# Patient Record
Sex: Female | Born: 1935 | Race: White | Hispanic: No | Marital: Married | Smoking: Former smoker
Health system: Southern US, Community
[De-identification: ages and names within clinical notes are randomized; demographics above are authoritative.]

## PROBLEM LIST (undated history)

## (undated) DIAGNOSIS — F0390 Unspecified dementia without behavioral disturbance: Secondary | ICD-10-CM

## (undated) DIAGNOSIS — I10 Essential (primary) hypertension: Secondary | ICD-10-CM

## (undated) DIAGNOSIS — J45909 Unspecified asthma, uncomplicated: Secondary | ICD-10-CM

## (undated) HISTORY — PX: TOTAL HIP ARTHROPLASTY: SHX124

## (undated) HISTORY — PX: ROTATOR CUFF REPAIR: SHX139

## (undated) HISTORY — PX: FEMUR FRACTURE SURGERY: SHX633

## (undated) HISTORY — PX: CHOLECYSTECTOMY: SHX55

## (undated) HISTORY — PX: ABDOMINAL HYSTERECTOMY: SHX81

---

## 1997-10-14 ENCOUNTER — Ambulatory Visit (HOSPITAL_COMMUNITY): Admission: RE | Admit: 1997-10-14 | Discharge: 1997-10-14 | Payer: Self-pay | Admitting: Sports Medicine

## 1997-10-14 ENCOUNTER — Encounter: Admission: RE | Admit: 1997-10-14 | Discharge: 1998-01-12 | Payer: Self-pay | Admitting: Orthopedic Surgery

## 1998-04-26 ENCOUNTER — Encounter: Payer: Self-pay | Admitting: Orthopedic Surgery

## 1998-04-26 ENCOUNTER — Inpatient Hospital Stay (HOSPITAL_COMMUNITY): Admission: RE | Admit: 1998-04-26 | Discharge: 1998-04-30 | Payer: Self-pay | Admitting: Orthopedic Surgery

## 1998-05-03 ENCOUNTER — Encounter (HOSPITAL_COMMUNITY): Admission: RE | Admit: 1998-05-03 | Discharge: 1998-08-01 | Payer: Self-pay | Admitting: Orthopedic Surgery

## 1999-04-28 ENCOUNTER — Encounter: Payer: Self-pay | Admitting: Orthopedic Surgery

## 1999-05-02 ENCOUNTER — Observation Stay (HOSPITAL_COMMUNITY): Admission: RE | Admit: 1999-05-02 | Discharge: 1999-05-03 | Payer: Self-pay | Admitting: Orthopedic Surgery

## 1999-12-06 ENCOUNTER — Encounter: Admission: RE | Admit: 1999-12-06 | Discharge: 1999-12-06 | Payer: Self-pay | Admitting: Geriatric Medicine

## 1999-12-06 ENCOUNTER — Encounter: Payer: Self-pay | Admitting: Geriatric Medicine

## 2000-02-06 ENCOUNTER — Encounter: Admission: RE | Admit: 2000-02-06 | Discharge: 2000-02-06 | Payer: Self-pay | Admitting: Geriatric Medicine

## 2000-02-06 ENCOUNTER — Encounter: Payer: Self-pay | Admitting: Geriatric Medicine

## 2001-02-06 ENCOUNTER — Encounter: Admission: RE | Admit: 2001-02-06 | Discharge: 2001-02-06 | Payer: Self-pay | Admitting: Geriatric Medicine

## 2001-02-06 ENCOUNTER — Encounter: Payer: Self-pay | Admitting: Geriatric Medicine

## 2002-12-08 ENCOUNTER — Encounter: Admission: RE | Admit: 2002-12-08 | Discharge: 2002-12-08 | Payer: Self-pay | Admitting: Geriatric Medicine

## 2002-12-08 ENCOUNTER — Encounter: Payer: Self-pay | Admitting: Geriatric Medicine

## 2005-05-18 ENCOUNTER — Encounter: Admission: RE | Admit: 2005-05-18 | Discharge: 2005-05-18 | Payer: Self-pay | Admitting: Geriatric Medicine

## 2011-02-04 ENCOUNTER — Emergency Department (HOSPITAL_COMMUNITY)
Admission: EM | Admit: 2011-02-04 | Discharge: 2011-02-04 | Disposition: A | Discharge status: Home / Self Care | Payer: Medicare Other | Attending: Emergency Medicine | Admitting: Emergency Medicine

## 2011-02-04 ENCOUNTER — Emergency Department (HOSPITAL_COMMUNITY): Payer: Medicare Other

## 2011-02-04 DIAGNOSIS — R05 Cough: Secondary | ICD-10-CM | POA: Insufficient documentation

## 2011-02-04 DIAGNOSIS — Z79899 Other long term (current) drug therapy: Secondary | ICD-10-CM | POA: Insufficient documentation

## 2011-02-04 DIAGNOSIS — I10 Essential (primary) hypertension: Secondary | ICD-10-CM | POA: Insufficient documentation

## 2011-02-04 DIAGNOSIS — M810 Age-related osteoporosis without current pathological fracture: Secondary | ICD-10-CM | POA: Insufficient documentation

## 2011-02-04 DIAGNOSIS — R0609 Other forms of dyspnea: Secondary | ICD-10-CM | POA: Insufficient documentation

## 2011-02-04 DIAGNOSIS — J189 Pneumonia, unspecified organism: Secondary | ICD-10-CM | POA: Insufficient documentation

## 2011-02-04 DIAGNOSIS — R059 Cough, unspecified: Secondary | ICD-10-CM | POA: Insufficient documentation

## 2011-02-04 DIAGNOSIS — R0602 Shortness of breath: Secondary | ICD-10-CM | POA: Insufficient documentation

## 2011-02-04 DIAGNOSIS — F068 Other specified mental disorders due to known physiological condition: Secondary | ICD-10-CM | POA: Insufficient documentation

## 2011-02-04 DIAGNOSIS — J45909 Unspecified asthma, uncomplicated: Secondary | ICD-10-CM | POA: Insufficient documentation

## 2011-02-04 DIAGNOSIS — R0989 Other specified symptoms and signs involving the circulatory and respiratory systems: Secondary | ICD-10-CM | POA: Insufficient documentation

## 2011-02-04 LAB — CBC
MCH: 28.5 pg (ref 26.0–34.0)
Platelets: 234 10*3/uL (ref 150–400)
RBC: 4.38 MIL/uL (ref 3.87–5.11)
RDW: 13.7 % (ref 11.5–15.5)
WBC: 6.9 10*3/uL (ref 4.0–10.5)

## 2011-02-04 LAB — DIFFERENTIAL
Basophils Relative: 0 % (ref 0–1)
Eosinophils Absolute: 0.1 10*3/uL (ref 0.0–0.7)
Monocytes Relative: 11 % (ref 3–12)
Neutrophils Relative %: 59 % (ref 43–77)

## 2011-02-04 LAB — CK TOTAL AND CKMB (NOT AT ARMC): Total CK: 173 U/L (ref 7–177)

## 2011-02-04 LAB — COMPREHENSIVE METABOLIC PANEL
AST: 22 U/L (ref 0–37)
BUN: 17 mg/dL (ref 6–23)
CO2: 28 mEq/L (ref 19–32)
Calcium: 9.4 mg/dL (ref 8.4–10.5)
Creatinine, Ser: 0.62 mg/dL (ref 0.50–1.10)
GFR calc Af Amer: >60 mL/min (ref >60)
GFR calc non Af Amer: >60 mL/min (ref >60)

## 2011-02-04 LAB — URINE MICROSCOPIC-ADD ON

## 2011-02-04 LAB — URINALYSIS, ROUTINE W REFLEX MICROSCOPIC
Glucose, UA: NEGATIVE mg/dL
pH: 7.5 (ref 5.0–8.0)

## 2011-02-04 LAB — TROPONIN I: Troponin I: <0.3 ng/mL (ref <0.30)

## 2011-04-11 ENCOUNTER — Emergency Department (HOSPITAL_COMMUNITY): Payer: Medicare Other

## 2011-04-11 ENCOUNTER — Emergency Department (HOSPITAL_COMMUNITY)
Admission: EM | Admit: 2011-04-11 | Discharge: 2011-04-11 | Disposition: A | Discharge status: Home / Self Care | Payer: Medicare Other | Attending: Emergency Medicine | Admitting: Emergency Medicine

## 2011-04-11 DIAGNOSIS — S0003XA Contusion of scalp, initial encounter: Secondary | ICD-10-CM | POA: Insufficient documentation

## 2011-04-11 DIAGNOSIS — S0083XA Contusion of other part of head, initial encounter: Secondary | ICD-10-CM | POA: Insufficient documentation

## 2011-04-11 DIAGNOSIS — W1809XA Striking against other object with subsequent fall, initial encounter: Secondary | ICD-10-CM | POA: Insufficient documentation

## 2011-04-11 DIAGNOSIS — I10 Essential (primary) hypertension: Secondary | ICD-10-CM | POA: Insufficient documentation

## 2011-04-11 DIAGNOSIS — R51 Headache: Secondary | ICD-10-CM | POA: Insufficient documentation

## 2011-04-11 DIAGNOSIS — F068 Other specified mental disorders due to known physiological condition: Secondary | ICD-10-CM | POA: Insufficient documentation

## 2011-04-11 DIAGNOSIS — Y92009 Unspecified place in unspecified non-institutional (private) residence as the place of occurrence of the external cause: Secondary | ICD-10-CM | POA: Insufficient documentation

## 2011-04-11 DIAGNOSIS — Z79899 Other long term (current) drug therapy: Secondary | ICD-10-CM | POA: Insufficient documentation

## 2012-01-06 ENCOUNTER — Encounter (HOSPITAL_COMMUNITY): Payer: Self-pay | Admitting: *Deleted

## 2012-01-06 ENCOUNTER — Emergency Department (HOSPITAL_COMMUNITY): Payer: Medicare Other

## 2012-01-06 ENCOUNTER — Emergency Department (HOSPITAL_COMMUNITY)
Admission: EM | Admit: 2012-01-06 | Discharge: 2012-01-06 | Disposition: A | Discharge status: Home / Self Care | Payer: Medicare Other | Attending: Emergency Medicine | Admitting: Emergency Medicine

## 2012-01-06 DIAGNOSIS — R5381 Other malaise: Secondary | ICD-10-CM | POA: Insufficient documentation

## 2012-01-06 DIAGNOSIS — F068 Other specified mental disorders due to known physiological condition: Secondary | ICD-10-CM | POA: Insufficient documentation

## 2012-01-06 DIAGNOSIS — Z8673 Personal history of transient ischemic attack (TIA), and cerebral infarction without residual deficits: Secondary | ICD-10-CM | POA: Insufficient documentation

## 2012-01-06 DIAGNOSIS — Z79899 Other long term (current) drug therapy: Secondary | ICD-10-CM | POA: Insufficient documentation

## 2012-01-06 DIAGNOSIS — N760 Acute vaginitis: Secondary | ICD-10-CM | POA: Insufficient documentation

## 2012-01-06 DIAGNOSIS — Z66 Do not resuscitate: Secondary | ICD-10-CM | POA: Insufficient documentation

## 2012-01-06 DIAGNOSIS — R51 Headache: Secondary | ICD-10-CM | POA: Insufficient documentation

## 2012-01-06 DIAGNOSIS — R4701 Aphasia: Secondary | ICD-10-CM | POA: Insufficient documentation

## 2012-01-06 DIAGNOSIS — I10 Essential (primary) hypertension: Secondary | ICD-10-CM | POA: Insufficient documentation

## 2012-01-06 DIAGNOSIS — J45909 Unspecified asthma, uncomplicated: Secondary | ICD-10-CM | POA: Insufficient documentation

## 2012-01-06 DIAGNOSIS — R4182 Altered mental status, unspecified: Secondary | ICD-10-CM | POA: Insufficient documentation

## 2012-01-06 DIAGNOSIS — L0390 Cellulitis, unspecified: Secondary | ICD-10-CM

## 2012-01-06 DIAGNOSIS — R471 Dysarthria and anarthria: Secondary | ICD-10-CM

## 2012-01-06 HISTORY — DX: Essential (primary) hypertension: I10

## 2012-01-06 HISTORY — DX: Unspecified dementia, unspecified severity, without behavioral disturbance, psychotic disturbance, mood disturbance, and anxiety: F03.90

## 2012-01-06 HISTORY — DX: Unspecified asthma, uncomplicated: J45.909

## 2012-01-06 LAB — DIFFERENTIAL
Basophils Absolute: 0 10*3/uL (ref 0.0–0.1)
Basophils Relative: 0 % (ref 0–1)
Eosinophils Absolute: 0.1 10*3/uL (ref 0.0–0.7)
Neutro Abs: 4.3 10*3/uL (ref 1.7–7.7)
Neutrophils Relative %: 64 % (ref 43–77)

## 2012-01-06 LAB — URINALYSIS, ROUTINE W REFLEX MICROSCOPIC
Bilirubin Urine: NEGATIVE
Ketones, ur: NEGATIVE mg/dL
Leukocytes, UA: NEGATIVE
Nitrite: NEGATIVE
Specific Gravity, Urine: 1.015 (ref 1.005–1.030)
Urobilinogen, UA: 0.2 mg/dL (ref 0.0–1.0)
pH: 7.5 (ref 5.0–8.0)

## 2012-01-06 LAB — BASIC METABOLIC PANEL
Chloride: 97 mEq/L (ref 96–112)
GFR calc Af Amer: 70 mL/min — ABNORMAL LOW (ref >90)
GFR calc non Af Amer: 60 mL/min — ABNORMAL LOW (ref >90)
Potassium: 3.7 mEq/L (ref 3.5–5.1)

## 2012-01-06 LAB — CBC
MCH: 28.5 pg (ref 26.0–34.0)
MCHC: 32.5 g/dL (ref 30.0–36.0)
Platelets: 200 10*3/uL (ref 150–400)
RDW: 13.8 % (ref 11.5–15.5)

## 2012-01-06 LAB — GLUCOSE, CAPILLARY: Glucose-Capillary: 86 mg/dL (ref 70–99)

## 2012-01-06 MED ORDER — LORAZEPAM 1 MG PO TABS
ORAL_TABLET | ORAL | Status: AC
  Filled 2012-01-06: qty 1

## 2012-01-06 MED ORDER — LORAZEPAM 1 MG PO TABS
1.0000 mg | ORAL_TABLET | Freq: Once | ORAL | Status: AC
  Administered 2012-01-06: 1 mg via ORAL

## 2012-01-06 MED ORDER — CEPHALEXIN 500 MG PO CAPS: 500.0000 mg | ORAL_CAPSULE | Freq: Two times a day (BID) | ORAL | Status: AC

## 2012-01-06 NOTE — ED Notes (Signed)
Daughter advised that he mother has had some confused thoughts and her speech has been inappropriate this morning.  Forgot how to eat breakfast and that she is shuffling her gait and that she normally ambulates with a cain.

## 2012-01-06 NOTE — ED Provider Notes (Signed)
History     CSN: [id number]  Arrival date & time 01/06/12  1337   First MD Initiated Contact with Patient 01/06/12 1459      Chief Complaint  Patient presents with  . Aphasia  . Weakness     Patient is a 78 y.o. female presenting with weakness. The history is provided by the patient and a relative. The history is limited by the condition of the patient.  Weakness The primary symptoms include headaches and altered mental status. Primary symptoms do not include syncope, loss of consciousness, fever or vomiting. Episode onset: an unknown time ago. The symptoms are unchanged. The neurological symptoms are diffuse.  The headache is associated with weakness.  Additional symptoms include weakness. Medical issues do not include seizures.  pt was last seen normal last night This morning patient was noted to be confused, had difficulty speaking (nonsensical and also dysarthria) and difficulty with walking No recent falls No fever Pt lives at home but has 24/7 care but can do some ADLs on her own at times but now having difficulty walking Family confirms she is a DNR She has no h/o CVA per family  Past Medical History  Diagnosis Date  . Asthma   . Hypertension   . Dementia     History reviewed. No pertinent past surgical history.  History reviewed. No pertinent family history.  History  Substance Use Topics  . Smoking status: Not on file  . Smokeless tobacco: Not on file  . Alcohol Use: No    OB History    Grav Para Term Preterm Abortions TAB SAB Ect Mult Living                  Review of Systems  Unable to perform ROS: Mental status change  Constitutional: Negative for fever.  Cardiovascular: Negative for syncope.  Gastrointestinal: Negative for vomiting.  Neurological: Positive for weakness and headaches. Negative for loss of consciousness.  Psychiatric/Behavioral: Positive for altered mental status.    Allergies  Review of patient's allergies indicates no known  allergies.  Home Medications   Current Outpatient Rx  Name Route Sig Dispense Refill  . ALBUTEROL SULFATE HFA 108 (90 BASE) MCG/ACT IN AERS Inhalation Inhale 2 puffs into the lungs every 6 (six) hours as needed. For shortness of breath.    Maximino Green[place] 18-103 MCG/ACT IN AERO Inhalation Inhale 2 puffs into the lungs 4 (four) times daily.    Marland Kitchen CALCIUM CARBONATE-VITAMIN D 500-200 MG-UNIT PO TABS Oral Take 1 tablet by mouth daily.    . DONEPEZIL HCL 10 MG PO TABS Oral Take 10 mg by mouth daily.    Marland Kitchen LORAZEPAM 0.5 MG PO TABS Oral Take 0.25 mg by mouth every 6 (six) hours as needed. For anxiety.    Marland Kitchen METOPROLOL SUCCINATE ER 50 MG PO TB24 Oral Take 50 mg by mouth daily. Take with or immediately following a meal.    . ADULT MULTIVITAMIN W/MINERALS CH Oral Take 1 tablet by mouth daily.    Marland Kitchen NISOLDIPINE ER 17 MG PO TB24 Oral Take 17 mg by mouth daily.    Marland Kitchen VITAMIN D (ERGOCALCIFEROL) 50000 UNITS PO CAPS Oral Take 50,000 Units by mouth every 7 (seven) days. Take on Tuesdays.      BP 165/76  Pulse 66  Temp 97.8 F (36.6 C) (Rectal)  Resp 23  SpO2 93%  Physical Exam CONSTITUTIONAL: Well developed/well nourished HEAD AND FACE: Normocephalic/atraumatic EYES: EOMI/PERRL ENMT: Mucous membranes moist NECK: supple no meningeal signs  SPINE:entire spine nontender CV: S1/S2 noted LUNGS: Lungs are clear to auscultation bilaterally, no apparent distress ABDOMEN: soft, nontender, no rebound or guarding GU:no cva tenderness NEURO: Pt is awake/alert, moves all extremitiesx4, no arm/leg drift.  No facial droop.  She has mild dysarthria, she is confused and unable to confirm current date or her birthdate EXTREMITIES: pulses normal, full ROM SKIN: warm, color normal PSYCH: no abnormalities of mood noted  ED Course  Procedures    Labs Reviewed  GLUCOSE, CAPILLARY  CBC  DIFFERENTIAL  BASIC METABOLIC PANEL  URINALYSIS, ROUTINE W REFLEX MICROSCOPIC   tPA in stroke considered but not given  due to:  Onset over 3-4.5hours  6:00 PM Pt with some mild agitation, wanting to go home Long discussion with family, and informed that she may have had stroke.  Would recommend admit, however they elect to take patient home.  She is a DNR, would not want further intervention and she has home care 24/7 She will start ASA and f/u with PCP this week Also has some erythema/tenderness to vulvar region that appears mild cellulitis (noticed by nurse during cath) Will start on keflex Discussed strict return precautions   MDM  Nursing notes including past medical history and social history reviewed and considered in documentation labs/vitals reviewed and considered        Date: 01/06/2012  Rate: 61  Rhythm: normal sinus rhythm  QRS Axis: normal  Intervals: normal  ST/T Wave abnormalities: nonspecific ST changes  Conduction Disutrbances:none  Narrative Interpretation:   Old EKG Reviewed: unchanged    Joya Gaskins, MD 01/06/12 [phone]

## 2012-01-06 NOTE — ED Notes (Signed)
Patient is resting comfortably. Returned from CT

## 2012-01-06 NOTE — ED Notes (Signed)
Family members report that pt was last seen normal last night, home health staff reports pt was not speaking clearly when she got up this am, was unable to feed herself or dress herself, having severe headache and unsteady gait.

## 2012-01-06 NOTE — ED Notes (Signed)
Patient discharged with  Her daughter with instructions and prescription NAD noted at the time of discharge

## 2012-01-06 NOTE — ED Notes (Signed)
Patient very agitated want to get out of the bed and go home

## 2013-03-15 ENCOUNTER — Inpatient Hospital Stay: Payer: Self-pay | Admitting: Internal Medicine

## 2013-03-15 ENCOUNTER — Ambulatory Visit: Payer: Self-pay | Admitting: Orthopaedic Surgery

## 2013-03-15 LAB — URINALYSIS, COMPLETE
Bacteria: NONE SEEN
Glucose,UR: NEGATIVE mg/dL (ref 0–75)
Ph: 7 (ref 4.5–8.0)
Protein: NEGATIVE
RBC,UR: 2 /HPF (ref 0–5)
Specific Gravity: 1.009 (ref 1.003–1.030)
Squamous Epithelial: <1

## 2013-03-15 LAB — COMPREHENSIVE METABOLIC PANEL
Albumin: 2.9 g/dL — ABNORMAL LOW (ref 3.4–5.0)
Alkaline Phosphatase: 91 U/L (ref 50–136)
EGFR (African American): >60
EGFR (Non-African Amer.): >60
Glucose: 150 mg/dL — ABNORMAL HIGH (ref 65–99)
Potassium: 3.4 mmol/L — ABNORMAL LOW (ref 3.5–5.1)
Total Protein: 7.4 g/dL (ref 6.4–8.2)

## 2013-03-15 LAB — CBC
HCT: 36.7 % (ref 35.0–47.0)
MCH: 28.5 pg (ref 26.0–34.0)
MCHC: 33.5 g/dL (ref 32.0–36.0)
Platelet: 319 10*3/uL (ref 150–440)
RBC: 4.3 10*6/uL (ref 3.80–5.20)
RDW: 12.7 % (ref 11.5–14.5)

## 2013-03-15 LAB — CK TOTAL AND CKMB (NOT AT ARMC)
CK, Total: 85 U/L (ref 21–215)
CK, Total: 190 U/L (ref 21–215)
CK-MB: 1.2 ng/mL (ref 0.5–3.6)
CK-MB: 2.3 ng/mL (ref 0.5–3.6)

## 2013-03-15 LAB — TROPONIN I
Troponin-I: <0.02 ng/mL
Troponin-I: <0.02 ng/mL

## 2013-03-16 LAB — CBC WITH DIFFERENTIAL/PLATELET
Basophil #: 0.1 10*3/uL (ref 0.0–0.1)
Basophil %: 0.5 %
Eosinophil #: 0.1 10*3/uL (ref 0.0–0.7)
HGB: 10.2 g/dL — ABNORMAL LOW (ref 12.0–16.0)
Lymphocyte #: 1.2 10*3/uL (ref 1.0–3.6)
Lymphocyte %: 10.6 %
Monocyte #: 1 x10 3/mm — ABNORMAL HIGH (ref 0.2–0.9)
RBC: 3.55 10*6/uL — ABNORMAL LOW (ref 3.80–5.20)
RDW: 12.4 % (ref 11.5–14.5)
WBC: 11 10*3/uL (ref 3.6–11.0)

## 2013-03-16 LAB — BASIC METABOLIC PANEL
BUN: 11 mg/dL (ref 7–18)
Chloride: 98 mmol/L (ref 98–107)
Co2: 28 mmol/L (ref 21–32)
EGFR (African American): >60
Potassium: 4.1 mmol/L (ref 3.5–5.1)
Sodium: 132 mmol/L — ABNORMAL LOW (ref 136–145)

## 2013-03-16 LAB — MAGNESIUM: Magnesium: 1.7 mg/dL — ABNORMAL LOW

## 2013-03-17 LAB — CBC WITH DIFFERENTIAL/PLATELET
HCT: 27.5 % — ABNORMAL LOW (ref 35.0–47.0)
Lymphocyte #: 1.2 10*3/uL (ref 1.0–3.6)
Lymphocyte %: 14.6 %
MCHC: 34.1 g/dL (ref 32.0–36.0)
MCV: 84 fL (ref 80–100)
Monocyte #: 0.7 x10 3/mm (ref 0.2–0.9)
Monocyte %: 8 %
Neutrophil #: 6.3 10*3/uL (ref 1.4–6.5)
Neutrophil %: 75.1 %
RBC: 3.26 10*6/uL — ABNORMAL LOW (ref 3.80–5.20)

## 2013-03-17 LAB — BASIC METABOLIC PANEL
Anion Gap: 5 — ABNORMAL LOW (ref 7–16)
BUN: 15 mg/dL (ref 7–18)
Co2: 28 mmol/L (ref 21–32)
EGFR (Non-African Amer.): >60
Glucose: 94 mg/dL (ref 65–99)
Potassium: 3.9 mmol/L (ref 3.5–5.1)

## 2013-03-18 LAB — MAGNESIUM: Magnesium: 1.9 mg/dL

## 2013-03-18 LAB — BASIC METABOLIC PANEL
BUN: 16 mg/dL (ref 7–18)
Co2: 26 mmol/L (ref 21–32)
Creatinine: 0.64 mg/dL (ref 0.60–1.30)
EGFR (Non-African Amer.): >60
Osmolality: 270 (ref 275–301)
Sodium: 134 mmol/L — ABNORMAL LOW (ref 136–145)

## 2013-03-19 LAB — BASIC METABOLIC PANEL
Anion Gap: 7 (ref 7–16)
BUN: 17 mg/dL (ref 7–18)
Calcium, Total: 8 mg/dL — ABNORMAL LOW (ref 8.5–10.1)
Chloride: 103 mmol/L (ref 98–107)
Creatinine: 0.55 mg/dL — ABNORMAL LOW (ref 0.60–1.30)
Glucose: 101 mg/dL — ABNORMAL HIGH (ref 65–99)
Osmolality: 272 (ref 275–301)

## 2013-04-28 ENCOUNTER — Other Ambulatory Visit (HOSPITAL_COMMUNITY): Payer: Self-pay | Admitting: Internal Medicine

## 2013-04-28 ENCOUNTER — Ambulatory Visit (HOSPITAL_COMMUNITY)
Admission: RE | Admit: 2013-04-28 | Discharge: 2013-04-28 | Disposition: A | Discharge status: Home / Self Care | Payer: Medicare Other | Source: Ambulatory Visit | Attending: Vascular Surgery | Admitting: Vascular Surgery

## 2013-04-28 DIAGNOSIS — R609 Edema, unspecified: Secondary | ICD-10-CM

## 2013-04-29 ENCOUNTER — Other Ambulatory Visit: Payer: Self-pay | Admitting: Internal Medicine

## 2013-04-29 ENCOUNTER — Ambulatory Visit
Admission: RE | Admit: 2013-04-29 | Discharge: 2013-04-29 | Disposition: A | Discharge status: Home / Self Care | Payer: Medicare Other | Source: Ambulatory Visit | Attending: Internal Medicine | Admitting: Internal Medicine

## 2013-04-29 DIAGNOSIS — R000 Tachycardia, unspecified: Secondary | ICD-10-CM

## 2013-04-29 DIAGNOSIS — R0902 Hypoxemia: Secondary | ICD-10-CM

## 2013-04-29 MED ORDER — IOHEXOL 350 MG/ML SOLN
100.0000 mL | Freq: Once | INTRAVENOUS | Status: AC | PRN
  Administered 2013-04-29: 100 mL via INTRAVENOUS

## 2013-05-29 ENCOUNTER — Encounter (HOSPITAL_COMMUNITY): Payer: Self-pay | Admitting: Emergency Medicine

## 2013-05-29 ENCOUNTER — Emergency Department (HOSPITAL_COMMUNITY): Payer: Medicare Other

## 2013-05-29 ENCOUNTER — Emergency Department (HOSPITAL_COMMUNITY)
Admission: EM | Admit: 2013-05-29 | Discharge: 2013-05-29 | Disposition: A | Discharge status: Home / Self Care | Payer: Medicare Other | Attending: Emergency Medicine | Admitting: Emergency Medicine

## 2013-05-29 DIAGNOSIS — L03031 Cellulitis of right toe: Secondary | ICD-10-CM

## 2013-05-29 DIAGNOSIS — W19XXXA Unspecified fall, initial encounter: Secondary | ICD-10-CM

## 2013-05-29 DIAGNOSIS — L02619 Cutaneous abscess of unspecified foot: Secondary | ICD-10-CM | POA: Insufficient documentation

## 2013-05-29 DIAGNOSIS — J45909 Unspecified asthma, uncomplicated: Secondary | ICD-10-CM | POA: Insufficient documentation

## 2013-05-29 DIAGNOSIS — Y939 Activity, unspecified: Secondary | ICD-10-CM | POA: Insufficient documentation

## 2013-05-29 DIAGNOSIS — S40019A Contusion of unspecified shoulder, initial encounter: Secondary | ICD-10-CM | POA: Insufficient documentation

## 2013-05-29 DIAGNOSIS — Z79899 Other long term (current) drug therapy: Secondary | ICD-10-CM | POA: Insufficient documentation

## 2013-05-29 DIAGNOSIS — S0100XA Unspecified open wound of scalp, initial encounter: Secondary | ICD-10-CM | POA: Insufficient documentation

## 2013-05-29 DIAGNOSIS — I10 Essential (primary) hypertension: Secondary | ICD-10-CM | POA: Insufficient documentation

## 2013-05-29 DIAGNOSIS — L03039 Cellulitis of unspecified toe: Secondary | ICD-10-CM | POA: Insufficient documentation

## 2013-05-29 DIAGNOSIS — S40012A Contusion of left shoulder, initial encounter: Secondary | ICD-10-CM

## 2013-05-29 DIAGNOSIS — S0101XA Laceration without foreign body of scalp, initial encounter: Secondary | ICD-10-CM

## 2013-05-29 DIAGNOSIS — W1809XA Striking against other object with subsequent fall, initial encounter: Secondary | ICD-10-CM | POA: Insufficient documentation

## 2013-05-29 DIAGNOSIS — F0390 Unspecified dementia without behavioral disturbance: Secondary | ICD-10-CM | POA: Insufficient documentation

## 2013-05-29 DIAGNOSIS — Z87891 Personal history of nicotine dependence: Secondary | ICD-10-CM | POA: Insufficient documentation

## 2013-05-29 DIAGNOSIS — Y92009 Unspecified place in unspecified non-institutional (private) residence as the place of occurrence of the external cause: Secondary | ICD-10-CM | POA: Insufficient documentation

## 2013-05-29 LAB — CBC WITH DIFFERENTIAL/PLATELET
Basophils Relative: 0 % (ref 0–1)
Eosinophils Absolute: 0.1 10*3/uL (ref 0.0–0.7)
Eosinophils Relative: 1 % (ref 0–5)
Hemoglobin: 9.8 g/dL — ABNORMAL LOW (ref 12.0–15.0)
Lymphs Abs: 1.6 10*3/uL (ref 0.7–4.0)
MCH: 27.8 pg (ref 26.0–34.0)
MCHC: 30.8 g/dL (ref 30.0–36.0)
MCV: 90.1 fL (ref 78.0–100.0)
Monocytes Relative: 10 % (ref 3–12)
Platelets: 166 10*3/uL (ref 150–400)
RBC: 3.53 MIL/uL — ABNORMAL LOW (ref 3.87–5.11)

## 2013-05-29 LAB — POCT I-STAT, CHEM 8
Creatinine, Ser: 1.1 mg/dL (ref 0.50–1.10)
Glucose, Bld: 99 mg/dL (ref 70–99)
HCT: 34 % — ABNORMAL LOW (ref 36.0–46.0)
Hemoglobin: 11.6 g/dL — ABNORMAL LOW (ref 12.0–15.0)
TCO2: 31 mmol/L (ref 0–100)

## 2013-05-29 LAB — PROTIME-INR: Prothrombin Time: 25.2 seconds — ABNORMAL HIGH (ref 11.6–15.2)

## 2013-05-29 MED ORDER — CLINDAMYCIN HCL 150 MG PO CAPS: 300.0000 mg | ORAL_CAPSULE | Freq: Four times a day (QID) | ORAL | Status: AC

## 2013-05-29 NOTE — ED Provider Notes (Signed)
CSN: [id number]     Arrival date & time 05/29/13  1912 History   First MD Initiated Contact with Patient 05/29/13 1916     Chief Complaint  Patient presents with  . Fall  . Head Laceration   (Consider location/radiation/quality/duration/timing/severity/associated sxs/prior Treatment) Patient is a 79 y.o. female presenting with scalp laceration. The history is provided by a relative and a caregiver. No language interpreter was used.  Head Laceration   Patient is a 79 year old Caucasian female with past medical history of dementia who comes emergency Department after fall. The fall was mechanical she recently received a left hip replacement. She began walking after the hip replacement 2 weeks ago. Today she chair lost her balance falling of falling backwards. She impacted the left side of her head and her left shoulder. She takes Coumadin as result has been bleeding significantly from her head wound. Patient does not remember the incident. She denies any pain. She does have significant dementia and has difficulty following a conversation and answering questions. In addition to the wound. The family also reports some erythema to his toe that she developed after tripping over a carpet couple of days ago. She does not have any pain to the site.  She has not had any fevers. Family is concerned she may need antibiotics for cellulitis.  Past Medical History  Diagnosis Date  . Asthma   . Hypertension   . Dementia    Past Surgical History  Procedure Laterality Date  . Femur fracture surgery Right   . Total hip arthroplasty Left   . Abdominal hysterectomy    . Rotator cuff repair      Family is unsure of laterality.   . Cholecystectomy     History reviewed. No pertinent family history. History  Substance Use Topics  . Smoking status: Former Games developer  . Smokeless tobacco: Not on file  . Alcohol Use: No   OB History   Grav Para Term Preterm Abortions TAB SAB Ect Mult Living                  Review of Systems  Unable to perform ROS: Dementia    Allergies  Review of patient's allergies indicates no known allergies.  Home Medications   Current Outpatient Rx  Name  Route  Sig  Dispense  Refill  . albuterol (PROVENTIL HFA;VENTOLIN HFA) 108 (90 BASE) MCG/ACT inhaler   Inhalation   Inhale 2 puffs into the lungs every 6 (six) hours as needed. For shortness of breath.         Marland Kitchen albuterol-ipratropium (COMBIVENT) 18-103 MCG/ACT inhaler   Inhalation   Inhale 2 puffs into the lungs 4 (four) times daily.         . calcium-vitamin D (CALCIUM 500+D) 500-200 MG-UNIT per tablet   Oral   Take 1 tablet by mouth daily.         . cyanocobalamin 500 MCG tablet   Oral   Take 500 mcg by mouth daily.         Marland Kitchen donepezil (ARICEPT) 10 MG tablet   Oral   Take 10 mg by mouth at bedtime.          . ferrous sulfate 325 (65 FE) MG tablet   Oral   Take 325 mg by mouth daily with breakfast.         . LORazepam (ATIVAN) 0.5 MG tablet   Oral   Take 0.5 mg by mouth every 6 (six) hours as needed for anxiety.  For anxiety.         . metoprolol (LOPRESSOR) 100 MG tablet   Oral   Take 100 mg by mouth 2 (two) times daily.         . Multiple Vitamin (MULTIVITAMIN WITH MINERALS) TABS   Oral   Take 1 tablet by mouth daily.         . nisoldipine (SULAR) 17 MG 24 hr tablet   Oral   Take 17 mg by mouth daily.         Marland Kitchen senna (SENOKOT) 8.6 MG TABS tablet   Oral   Take 1 tablet by mouth 2 (two) times daily.         . Vitamin D, Ergocalciferol, (DRISDOL) 50000 UNITS CAPS   Oral   Take 50,000 Units by mouth every 7 (seven) days. Take on Tuesdays.         . clindamycin (CLEOCIN) 150 MG capsule   Oral   Take 2 capsules (300 mg total) by mouth every 6 (six) hours.   56 capsule   0    BP 121/76  Pulse 101  Temp(Src) 97.9 F (36.6 C)  Resp 17  Ht 5\' 2"  (1.575 m)  Wt 130 lb (58.968 kg)  BMI 23.77 kg/m2  SpO2 96% Physical Exam  Nursing note and vitals  reviewed. Constitutional: She is oriented to person, place, and time. She appears well-developed and well-nourished. No distress.  HENT:  Head: Normocephalic. Head is with contusion (left occipital region) and with laceration. Head is without abrasion.  Bleeding laceration to the left occipital region.  4 cm in length.   Eyes: Pupils are equal, round, and reactive to light. Right pupil is reactive. Left pupil is reactive. Pupils are equal.  Neck: Normal range of motion. No spinous process tenderness and no muscular tenderness present.  Cardiovascular: Normal rate, regular rhythm, normal heart sounds and intact distal pulses.   Pulmonary/Chest: Effort normal. No respiratory distress. She has no wheezes. She exhibits no tenderness.  Abdominal: Soft. Bowel sounds are normal. She exhibits no distension. There is no tenderness. There is no rebound and no guarding.  Musculoskeletal:  Ecchymosis to the posterior aspect of the left shoulder.   Neurological: She is alert and oriented to person, place, and time. She has normal strength. No cranial nerve deficit or sensory deficit. She exhibits normal muscle tone. Coordination and gait normal.  Skin: Skin is warm and dry.    ED Course  LACERATION REPAIR Date/Time: 05/29/2013 9:43 PM Performed by: Bethann Berkshire Authorized by: Bethann Berkshire Consent: Verbal consent obtained. Consent given by: guardian Patient understanding: patient states understanding of the procedure being performed Patient consent: the patient's understanding of the procedure matches consent given Time out: Immediately prior to procedure a "time out" was called to verify the correct patient, procedure, equipment, support staff and site/side marked as required. Body area: head/neck Location details: scalp Laceration length: 5 cm Foreign bodies: no foreign bodies Tendon involvement: none Nerve involvement: none Vascular damage: no Patient sedated: no Preparation: Patient was  prepped and draped in the usual sterile fashion. Irrigation solution: saline Irrigation method: syringe Amount of cleaning: standard Debridement: none Degree of undermining: none Skin closure: staples Number of sutures: 8 Approximation: close Approximation difficulty: simple Dressing: antibiotic ointment Patient tolerance: Patient tolerated the procedure well with no immediate complications.   (including critical care time) Labs Review Labs Reviewed  PROTIME-INR - Abnormal; Notable for the following:    Prothrombin Time 25.2 (*)    INR  2.38 (*)    All other components within normal limits  CBC WITH DIFFERENTIAL - Abnormal; Notable for the following:    RBC 3.53 (*)    Hemoglobin 9.8 (*)    HCT 31.8 (*)    RDW 19.0 (*)    All other components within normal limits  POCT I-STAT, CHEM 8 - Abnormal; Notable for the following:    Chloride 95 (*)    Hemoglobin 11.6 (*)    HCT 34.0 (*)    All other components within normal limits   Imaging Review Ct Head Wo Contrast  05/29/2013   CLINICAL DATA:  Fall, dementia.  Lacerations.  EXAM: CT HEAD WITHOUT CONTRAST  CT CERVICAL SPINE WITHOUT CONTRAST  TECHNIQUE: Multidetector CT imaging of the head and cervical spine was performed following the standard protocol without intravenous contrast. Multiplanar CT image reconstructions of the cervical spine were also generated.  COMPARISON:  Head CT 01/06/2012.  FINDINGS: CT HEAD FINDINGS  No intracranial hemorrhage. No parenchymal contusion. No midline shift or mass effect. Basilar cisterns are patent. No skull base fracture. No fluid in the paranasal sinuses or mastoid air cells. There is extensive periventricular subcortical white matter hypodensities. Cortical atrophy is present.  There are skin staples and mild scalp swelling over the left parietal bone.  CT CERVICAL SPINE FINDINGS  No prevertebral soft tissue swelling. Normal alignment of cervical vertebral bodies. No loss of vertebral body height.  Normal facet articulation. Normal craniocervical junction.  No evidence epidural or paraspinal hematoma.  There is straightening normal cervical lordosis. There is bulky osteophytes from C4 through C6. Review of the lung apices demonstrate a moderate layering right pleural effusion.  IMPRESSION: 1. No acute intracranial trauma 2. Severe microvascular disease and chronic atrophy are unchanged. 3. Skin staples of the left parietal bone. 4. Multilevel disc osteophytic disease. No evidence of cervical spine fracture. 5. Layering right pleural effusion.  Consider chest radiograph.   Electronically Signed   By: Genevive Bi M.D.   On: 05/29/2013 20:16   Ct Cervical Spine Wo Contrast  05/29/2013   CLINICAL DATA:  Fall, dementia.  Lacerations.  EXAM: CT HEAD WITHOUT CONTRAST  CT CERVICAL SPINE WITHOUT CONTRAST  TECHNIQUE: Multidetector CT imaging of the head and cervical spine was performed following the standard protocol without intravenous contrast. Multiplanar CT image reconstructions of the cervical spine were also generated.  COMPARISON:  Head CT 01/06/2012.  FINDINGS: CT HEAD FINDINGS  No intracranial hemorrhage. No parenchymal contusion. No midline shift or mass effect. Basilar cisterns are patent. No skull base fracture. No fluid in the paranasal sinuses or mastoid air cells. There is extensive periventricular subcortical white matter hypodensities. Cortical atrophy is present.  There are skin staples and mild scalp swelling over the left parietal bone.  CT CERVICAL SPINE FINDINGS  No prevertebral soft tissue swelling. Normal alignment of cervical vertebral bodies. No loss of vertebral body height. Normal facet articulation. Normal craniocervical junction.  No evidence epidural or paraspinal hematoma.  There is straightening normal cervical lordosis. There is bulky osteophytes from C4 through C6. Review of the lung apices demonstrate a moderate layering right pleural effusion.  IMPRESSION: 1. No acute  intracranial trauma 2. Severe microvascular disease and chronic atrophy are unchanged. 3. Skin staples of the left parietal bone. 4. Multilevel disc osteophytic disease. No evidence of cervical spine fracture. 5. Layering right pleural effusion.  Consider chest radiograph.   Electronically Signed   By: Genevive Bi M.D.   On: 05/29/2013 20:16  Dg Shoulder Left  05/29/2013   CLINICAL DATA:  Fall today.  Head laceration.  Left shoulder pain.  EXAM: LEFT SHOULDER - 2+ VIEW  COMPARISON:  Chest x-ray on 01/06/2012  FINDINGS: There is no acute fracture or dislocation. Significant subacromial space narrowing, glenohumeral joint space narrowing identified, consistent with chronic change. The left lung apex is clear.  IMPRESSION: 1. Chronic changes of left shoulder, consistent with chronic rotator cuff injury. 2.  No evidence for acute  abnormality.   Electronically Signed   By: Rosalie Gums M.D.   On: 05/29/2013 20:23    EKG Interpretation   None       MDM  Patient is a 79 year old Caucasian female with past medical history of dementia comes emergency department today after fall. Physical exam as above. Initial workup included a chem 8, PT/INR, CBC, left shoulder x-ray, CT of her head, CT of the C-spine. UA demonstrated a hemoglobin 11.6, otherwise unremarkable. INR was 2.38. CBC had a WBC of 6.3 otherwise unremarkable. Left shoulder x-ray demonstrated no fracture or malalignment. CT of the head demonstrated no acute intracranial injury. This demonstrated right pleural effusion. Patient has had no cough, no fevers, and no shortness of breath as results was felt to require a chest x-ray to evaluate this further at this time.  Patient's wound was repaired as detailed above. Patient's family requested that she be discharged quickly so they can take her back home to sleep.  Appearance of patient's toes consistent with possible cellulitis. Does not appear to be an abscess that requires drainage at this time.  Patient's family did not want further evaluation for the toe at this time. The request that she be treated with antibiotics if necessary and stated they would followup with her primary care physician next week. This was felt to be reasonable. Patient was provided with a prescription for clindamycin. Instructed to take this for times daily over the next 7 days. Instructed to return to emergency department if she develops lethargy, headache, numbness, weakness, fevers, chills, or worsening erythema to the toe. Family expressed understanding. Patient was discharged in stable condition. Labs and imaging reviewed by myself and considered and medical decision-making. Imaging was interpreted by radiology. Care was discussed with my attending Dr. Radford Pax.   1. Fall, initial encounter   2. Scalp laceration, initial encounter   3. Shoulder contusion, left, initial encounter   4. Cellulitis of toe, right       Bethann Berkshire, MD 05/29/13 [phone]

## 2013-05-29 NOTE — ED Notes (Signed)
Pt returned from radiology.

## 2013-05-29 NOTE — ED Notes (Signed)
Report per pt's family. Pt was at home, and allegedly got out of bed, and fell backwards. Family reports pt hit her head on a chair. Pt has hx of alzheimer's, and has no recollection of the incident. Wound appears to be actively bleeding. Pt in NAD.

## 2013-05-29 NOTE — ED Notes (Signed)
Schmitt, MD is aware of the pt's injury to the 4th digit on her right toe.

## 2013-05-31 NOTE — ED Provider Notes (Addendum)
I saw and evaluated the patient, reviewed the resident's note and I agree with the findings and plan.   .Face to face Exam:  General:  Awake HEENT:  Laceration as noted Resp:  Normal effort Abd:  Nondistended Neuro:No focal weakness   Nelia Shi, MD 05/31/13 1333  I saw and reviewed the EKG interpretation of the resident and agree with the findings.    Nelia Shi, MD 06/19/13 2137

## 2013-06-15 DEATH — deceased

## 2014-11-05 NOTE — H&P (Signed)
PATIENT NAME:  Tina Maddox, Tina Maddox MR#:  [id number] DATE OF BIRTH:  15-Sep-1915  DATE OF ADMISSION:  03/15/2013  REFERRING PHYSICIAN:  Dr. Charlesetta Ivory.   PRIMARY CARE PHYSICIAN: Clarice Pole   CHIEF COMPLAINT: Pain, with right hip pain.   HISTORY OF PRESENT ILLNESS: This is a 81 year old female with a significant past medical history of hypertension, Alzheimer's dementia, presents with a fall. The patient has 24-hour care.  She was being helped by her aide from the wheelchair to the bathroom where she fell and she had complaints of right hip pain, which prompted them to bring her to the ED. As well, the patient had a bump on the back of her head when she fell. The patient's  CT head without contrast did not show any acute intracranial abnormality, but her right hip x-ray did show evidence of a right hip intertrochanteric fracture.   The patient had a basic work-up in the ED, including her blood work and EKG. The patient was noticed to be in a-fib, which appears to be new-onset. Her family denies her having any arrhythmias in the past, or a-fib. As well, she was in mild RVR with a heart rate in the 110's.  Hospitalist service was requested to admit the patient for further management and workup, and need for surgical intervention for hip repair. The patient has advanced dementia. Daughter at bedside, and she gives most of the history.   PAST MEDICAL HISTORY: 1.  Hypertension.  2.  Alzheimer's dementia.   PAST SURGICAL HISTORY: 1.  History of rotator cuff repair.  2.  Hysterectomy.  3.  Left hip replacement.   SOCIAL HISTORY: The patient lives at home with 24/7 hour care. No history of smoking or alcohol use in the past.   FAMILY HISTORY: Daughter denies any family history of coronary artery disease or diabetes mellitus.   REVIEW OF SYSTEMS: The patient has advanced dementia. Cannot give any reliable history of review of systems due to her dementia, but in general she answers most of the  questions. She denies any fever or chills, weakness. Denies any blurry vision, visual problem, or any nausea, or epistaxis, or shortness of breath or chest pain. Denies nausea, vomiting, abdominal pain. Even denies any hip pain, any dysuria symptoms, any lightheadedness or dizziness. As mentioned, cannot obtain a reliable review of systems because it is unreliable due to her dementia.   ALLERGIES: No known drug allergies.   HOME MEDICATIONS: 1.  Metoprolol succinate 50 mg oral daily.  2.  Aspirin 81 mg oral daily.  3.  Lorazepam 0.5 mg every 6 hours as needed.  4.  ProAir, as needed.  5.  Nisoldipine 17 mg oral daily.  6.  Multivitamin 1 tablet oral daily.  7.  Donepezil 10 mg oral at bedtime.  8.  Vitamin B12 500 mcg oral daily.   PHYSICAL EXAMINATION: VITAL SIGNS: Temperature 98.4, pulse 92, respiratory rate 19, blood pressure 114/83, saturating 94% on room air.  GENERAL: Frail, elderly female who looks comfortable and in no apparent distress.  HEENT: Head normocephalic. Pupils equal, reactive to light. Pink conjunctivae. Anicteric sclerae. Moist oral mucosa.  NECK: Supple. No thyromegaly. No JVD.  CHEST: Good air entry bilaterally. No wheezing, rales, rhonchi.  CARDIOVASCULAR: S1, S2 heard. No rubs, murmurs or gallops. Has tachycardia. Rate  irregular.  ABDOMEN: Soft, nontender, nondistended. Bowel sounds present.  EXTREMITIES: Good pulses felt bilaterally. No edema. No clubbing. No cyanosis. Right lower extremity is mildly shortened and is rotated externally.  NEUROLOGIC: The patient's exam is grossly intact.   MOTOR: Appears to be grossly normal, and no focal deficits.  NEUROLOGIC: The patient is awake, alert x 1; confused.   PERTINENT LABS: Glucose 150, BUN 10, creatinine 0.72, sodium 130, potassium 3.4, chloride 92, CO2 32, ALT 15, AST 18, alk phos 91, total bilirubin 0.9. Troponin less than 0.02. White blood cells 11.7, hemoglobin 12.3, hematocrit 36.7, platelets 319.    Urinalysis negative for leukocyte esterase and nitrites.   EKG showing a-fib with RVR at 111 beats per minute.   ASSESSMENT AND PLAN: 1.  Status post mechanical fall, with right hip fracture: The patient was noted to have a mechanical fall. No syncope, presyncope or altered mental status. The patient appears to be having a right intertrochanteric hip fracture. Will need surgical repair, but currently she is in  new-onset atrial fibrillation atrial fibrillation with rapid ventricular response. The patient will need further cardiac clearance and evaluation prior to proceeding with surgery. I get the patient admitted to telemetry. Will continue to cycle her cardiac enzymes and will check a 2-D echo. As well, will start her back on her metoprolol succinate, and if it does not control her heart rate she will need to be on a larger dose. As well will consult cardiology for further evaluation and clearance prior to proceeding to surgery.  2.  Atrial fibrillation, with rapid ventricular response: Appears to be new-onset. Will admit patient to telemetry. Will continue to cycle her cardiac enzymes. Will obtain 2-D echo. Will consult cardiology. The patient will be resumed back on her metoprolol. She will be getting her first dose now. If it does not control her heart rate we will try to manage with p.o. Cardizem or IV Cardizem pushes, depending on her response.  3.  Dementia: Continue with donepezil.  4.  Hypertension: Blood pressure is acceptable. Will continue her on metoprolol.  5.  Deep vein thrombosis prophylaxis and pain management, as per orthopedics.   CODE STATUS IS FULL CODE. DAUGHTER IS AT BEDSIDE, AND SHE BROUGHT HER DNR/DNI SHEET.   Total time spent on admission and patient care: Fifty minutes.    ____________________________ Albertine Patricia, MD dse:dm D: 03/15/2013 07:44:55 ET T: 03/15/2013 08:51:55 ET JOB#: [id number]  cc: Albertine Patricia, MD, <Dictator> Breann Losano Graciela Husbands  MD ELECTRONICALLY SIGNED 03/17/2013 2:55

## 2014-11-05 NOTE — Consult Note (Signed)
PATIENT NAME:  Tina Maddox, Tina Maddox MR#:  [id number] DATE OF BIRTH:  10-23-1915  DATE OF CONSULTATION:  03/15/2013  REFERRING PHYSICIAN:  Phillips Climes, MD CONSULTING PHYSICIAN:  Mary Sella, MD  PRIMARY CARE PHYSICIAN:  Clarice Pole.  CHIEF COMPLAINT:  Right hip pain.    HISTORY OF PRESENT ILLNESS:  A 81 year old woman being helped by her aide in a wheelchair to the bathroom when she fell, had complaints of right hip pain and was brought to the Emergency Department for evaluation.  On subsequent evaluation, she was found to have a right intertrochanteric hip fracture.  The patient also underwent CT of her head with no intracranial abnormalities.  The patient did not have any other complaints in her upper extremities or left hip.  She has previously had a right shoulder replacement as well as a left hip replacement per the patient and family.  The patient was evaluated by the internal medicine service and admitted under hip fracture protocol.    The patient states that she has pain in her right hip that is worse with movement, better with rest, rates it as moderate, achy, sore in nature, relieved by pain medication.  Did not have hip pain prior.  Per the patient's daughter and granddaughter, subsequently has been becoming more forgetful as they believe her Alzheimer's dementia has been progressing over the past few weeks to months.  She also has a significant past medical history including arrhythmias and mild RVR.    PAST MEDICAL HISTORY:  Hypertension, Alzheimer's disease.  PAST SURGICAL HISTORY:  Rotator cuff repair, hysterectomy and left total hip arthroplasty.    SOCIAL HISTORY:  The patient lives at home with her nurse's aide with 24/7 care.  No other history of smoking, drugs or alcohol abuse.    FAMILY HISTORY:  Noncontributory.  REVIEW OF SYSTEMS:  Positive for dementia.   GENERAL:  Negative. EYES:  Negative. ENT:  Negative. PULMONARY:  Negative. CARDIOVASCULAR:  Negative.    GASTROINTESTINAL:  Negative. GENITOURINARY:  Negative.    Today, the patient does have rapid heart rate with RVR.  No dizziness or lightheadedness.    ALLERGIES:  No known drug allergies.  HOME MEDICATIONS 1.  Metoprolol 50 mg daily.  2.  Aspirin 81 mg daily.  3.  Lorazepam 0.5 mg q.6 hours p.r.n. 4.  ProAir p.r.n. 5.  Nisoldipine 17 mg oral daily. 6.  Multivitamin daily.   7.  Donepezil 10 mg at bedtime.   8.  Vitamin B12 pills daily.    PHYSICAL EXAM VITAL SIGNS:  Temperature is 36.8, pulse is 112, respirations normal , blood pressure 155/88, pulse oximetry is 94% on room air.  GENERAL:  A frail, elderly woman in no acute distress.   NECK:  Supple. CHEST:  Symmetric chest rise. No wheezing, rales. CARDIOVASCULAR:  Positive for tachycardia.   EXTREMITIES:  Examination of bilateral upper extremities intact sensation C5-T1.  No crepitus or issues with range of motion of the fingers, wrists, elbows and shoulders.  Left hip is stable to flexion, extension, log roll, knee flexion and extension, AVF, APF, EHL, FHL.  Has intact sensation bilateral lower extremities L3 to S1.  The patient has +1 posterior tibialis pulse bilateral symmetric and +1 radial pulse bilateral symmetric.    LABORATORY DATA:  From this morning, glucose 150, BUN 10, creatinine 0.72, sodium 130, potassium 3.4, chloride 92, CO2 32, ALT 15, AST 18, alk phos 91, total bilirubin 0.9.  Troponin is less than 0.02.  WBC 11.7, hemoglobin 12.3,  hematocrit 36.7, platelets 319.  UA is negative.  EKG shows A. fib with RVR.    ASSESSMENT AND PLAN:  Status post fall with right intertrochanteric hip fracture with no previous hip pain in this hip prior.  Speaking with the medicine team, they recommend cardiac clearance including cardiology consult as well as a 2-D echo prior to operation.  She will be posted tentatively for tomorrow for an intramedullary hip screw for her right hip on a fracture table.  I spoke to both the patient's  daughter and her granddaughter about the risks and benefits and complications of this procedure including a high incidence of 1-year mortality based on her age, comorbidities as well as the hip fracture itself.  The patient will be on chemo prophylaxis for deep venous thrombosis.  She will receive 1 dose of Lovenox today and will continue to manage the patient's pain at this point until surgery tomorrow.  She will be n.p.o. after midnight.    ____________________________ Mary Sella, MD mag:cs D: 03/15/2013 10:09:00 ET T: 03/15/2013 14:49:44 ET JOB#: [id number]  cc: Mary Sella, MD, <Dictator> Mary Sella MD ELECTRONICALLY SIGNED 03/16/2013 9:56

## 2014-11-05 NOTE — Discharge Summary (Signed)
PATIENT NAME:  Tina Maddox, Tina Maddox MR#:  [id number] DATE OF BIRTH:  09/02/1915  DATE OF ADMISSION:  03/15/2013 DATE OF DISCHARGE:  03/19/2013  ADMITTING DIAGNOSIS: Right intertrochanteric hip fracture.    DISCHARGE DIAGNOSIS: Right intertrochanteric hip fracture.   OPERATION: On 03/16/2013, the patient had a right hip intertrochanteric fracture ORIF done by Dr. Danton Sewer as the [facility] orthopedic surgeon. The patient had blood loss of 50 mL.   COMPLICATIONS: None.   The patient was stabilized, brought to the recovery room, and brought to the orthopedic floor and transferred to Dr. Vista Mink care.   HISTORY: The patient is a 81 year old female who presented to the hospital after a fall, injuring her right leg. The patient could not put any pressure on the leg or stand with transfers.  The patient had x-rays which revealed an intertrochanteric hip fracture.   PHYSICAL EXAMINATION: GENERAL: Elderly female with no real discomfort, in no apparent distress.  CHEST: Clear bilaterally.  CARDIOVASCULAR: Normal rhythm with irregular rate and tachycardia.  MUSCULOSKELETAL: In regard to the right lower extremity, there is mild shortening and external rotation with pain with any attempt at manipulation motion.   HOSPITAL COURSE: After initial admission on 03/15/2013 the patient had surgery the following day. The patient did have internal medicine following the patient for tachycardia and atrial fibrillation. On the day of surgery her hemoglobin was at 10.2. The following day it was at 9.4 and on postoperative day 2 hemoglobin was at 10.0 and remained stable there. The patient did have low Potassium initially at 132, which incrementally came up by postoperative day 2 to 134 and was 135 on the day of discharge. The patient did well although she had confusion and was using Haldol for stability there. The patient did physical therapy, but only stood, with no real transfers or ambulation. The patient  was sent to rehab on 03/19/2013.   CONDITION AT DISCHARGE: Stable.   DISPOSITION: The patient was sent to rehab.   DISCHARGE INSTRUCTIONS: The patient will follow-up in the clinic for orthopedics in 2 weeks for staple removal.  The patient will have the affected leg with partial weight-bearing with ambulation. The patient will have the leg raised on 1 to 2 pillows, encouraged to do bed rolls. The patient will have the knee-high TED hose on both legs removed one hour every 8 hours. The patient will have a regular diet. The patient will have her dressing clean and dry and try not to get it wet. The rehab center will call the clinic if there is any bright red bleeding, calf pain, or bowel or bladder difficulty, or any fever greater than 101.5. The patient will do physical therapy and occupational therapy per protocol.   DISCHARGE MEDICATIONS: Vitamin B12 1 tablet once a day, donepezil 10 mg once a day at bedtime, metoprolol 50 mg 1 tablet daily, ProAir 90 mcg inhalation 2 puffs 4 times a day, nisoldipine 17 mg once a day, multivitamin once a day, Tylenol 325 mg 2 tablets every 4 hours as needed for pain or fever greater than 100.4. Norco 325/5 mg 1 tablet q 4 to 6 hours p.r.n. for pain, Lovenox 30 mg subcutaneous once a day for 24 days, Haldol 1 mg orally every 6 hours as needed for agitation, lorazepam 0.5 mg 1 tablet every 6 hours as needed for anxiety, Senokot 1 tablet p.o. b.i.d., bisacodyl 10 mg rectally p.r.n. for constipation.  ____________________________ Shela Commons. Dedra Skeens, [place] jtm:sg D: 03/19/2013 07:41:00 ET T: 03/19/2013 07:57:40 ET  JOB#: [id number]  cc: J. Dedra Skeensodd Lilyrose Tanney, GeorgiaPA, <Dictator> J Katrina Brosh [facility] PA ELECTRONICALLY SIGNED 03/28/2013 6:24

## 2014-11-05 NOTE — Op Note (Signed)
PATIENT NAME:  Tina Maddox, Tina Maddox MR#:  [id number] DATE OF BIRTH:  11/10/1915  DATE OF PROCEDURE:  03/16/2013  PREOPERATIVE DIAGNOSIS:  Right intertrochanteric hip fracture.    POSTOPERATIVE DIAGNOSIS:  Right intertrochanteric hip fracture.  ATTENDING SURGEON:  Arvella Merles, MD  INDICATIONS FOR PROCEDURE:  Tina Maddox is a 81 year old woman who, on the 31st of August, during the night fell on her right hip, suffered the above-mentioned hip fracture.  She was admitted under hip fracture protocol by Dr. Waldron Labs.  She has a significant past medical history of hypertension, Alzheimer's.  She was being helped by her aide worker from a wheelchair to the bathroom when she fell, had the right hip pain.  She did undergo a CT of her head which did not show any abnormalities but she did have the above-mentioned hip fracture.  I met the patient's daughter and granddaughter on the 31st.  We discussed the risks and benefits of surgery.  Informed consent was signed.  They had a chance to ask questions, all questions were answered.  They understand that hip fractures carry a high mortality rate, usually within 1 year of hip fractures.  We also spoke about calcium and vitamin D supplementation for not only healing of this fracture but also for the daughter and granddaughter as well.    DESCRIPTION OF PROCEDURE:  The patient's right hip was marked.  The patient was brought to the Operating Room.  Timeout was performed, indicating the right hip.  The patient underwent spinal anesthesia.  Preoperative antibiotics were given.  The patient was placed supine on a fracture table.  The patient was brought down to the well-padded perineal post.  She had some light sedation along with her spinal.  After appropriate positioning and sedation, we used the image intensifier to reduce her fracture.  Once adequate reduction was obtained, the patient was then prepped and draped in the usual sterile fashion.    A 4 cm incision was  made with knife and Bovie electrocautery approximately 3 cm proximal to the tip of the greater trochanter.  The abductors were divided and a guidewire was placed near the tip of the grater trochanter.  This was confirmed on both AP and lateral imaging.  Once adequate position was obtained, guidewire was advanced past the area where the lesser trochanter used to be.  We then used an opening reamer and proceeded to insert a 12 mm, 130-degree trochanteric fixation nail of 177 mm.  We inserted nail in optimal position.  We put the aiming arm on, placed a guidewire into the head.  The position of the guidewire was confirmed on both AP and lateral imaging.  A 95 mm helical blade was then impacted into the head.  We then proceeded to lock the blade to the implant.  We then put the aiming arm for the distal interlock screw.  Sharp dissection through the skin was done with Bovie and sharp dissection through the IT band down to bone.  We then placed the cannulated arm and drilled through the interlock and placed a 32 mm screw, confirmed on both AP and lateral fluoroscopy.  We then made sure all components of the implant were tightened.  Screw distal interlock was tight. Aiming arm was subsequently taken off and the nail insertion guide was removed.  The patient had final x-rays taken, AP and lateral, demonstrating a stable reduction of her intertrochanteric hip fracture.    Wounds were then copiously irrigated.  The IT band was closed with  0 and the abductor gluteus fascia was closed.  We then used 2-0 on the subcu and staples for the skin with Dermabond.  Sterile dressings were applied.  The patient was successfully transferred to PACU with no complications.    BLOOD LOSS:  50 mL.    COMPLICATIONS:  None.    COUNTS:  Correct.  FLUIDS:  Per anesthesia.    SPECIMENS:  None.    POSTOPERATIVE COURSE:  The patient will be weight-bearing as tolerated right lower extremity.  I spoke to the patient's family afterwards.   They demonstrated an understanding of the patient's prognosis and that she will most likely go to a skilled nursing facility.  The patient will follow up with Dr. Joie Bimler in 2 weeks for staple removal.    ____________________________ Mary Sella, MD mag:cs D: 03/16/2013 19:23:22 ET T: 03/16/2013 20:08:53 ET JOB#: [id number]  cc: Mary Sella, MD, <Dictator> Mary Sella MD ELECTRONICALLY SIGNED 03/16/2013 22:32

## 2014-11-05 NOTE — Consult Note (Signed)
Brief Consult Note: Diagnosis: AF, HR mildly elevated, preo-op R hip surgery.   Patient was seen by consultant.   Consult note dictated.   Comments: REC  Agree with current therapy, cont metoprolol pre, peri, post-op, no further cardiac diagnostics.  Electronic Signatures: Marcina MillardParaschos, Tyarra Nolton (MD)  (Signed 31-Aug-14 10:37)  Authored: Brief Consult Note   Last Updated: 31-Aug-14 10:37 by Marcina MillardParaschos, Marvis Bakken (MD)

## 2014-11-05 NOTE — Consult Note (Signed)
PATIENT NAME:  Tina Maddox, Tina MR#:  [id number] DATE OF BIRTH:  [birth year]  DATE OF CONSULTATION:  03/15/2013  PRIMARY CARE PHYSICIAN:  Bonney RousselScott Howard.  CHIEF COMPLAINT: Right hip pain.   REASON FOR CONSULTATION: Consultation requested for preoperative evaluation and atrial fibrillation prior to right hip surgery.   HISTORY OF PRESENT ILLNESS: The patient is a 81 year old female with Alzheimer's dementia who was admitted on 03/15/2013 following a fall, and fractured her right hip. The patient is planned to have surgery on 03/16/2013.   The patient denies a prior history of myocardial infarction, congestive heart failure, chronic kidney disease, stroke or diabetes. Upon admission the patient was noted to be in atrial fibrillation with a ventricular rate at 110 BPM. The patient denies chest pain, shortness of breath or palpitations.   PAST MEDICAL HISTORY:  1.  Hypertension.  2.  Alzheimer's dementia.   MEDICATIONS ON ADMISSION: Aspirin 81 mg daily, metoprolol succinate 50 mg daily, lorazepam 0.5 mg q.6 p.r.n., ProAir p.r.n., felodipine 17 mg daily, multivitamin 1 daily, donepezil 10 mg at bedtime, vitamin B12 500 mcg daily.   SOCIAL HISTORY: The patient currently lives at home with 27/7 care. She denies tobacco abuse.   FAMILY HISTORY: No family history for coronary artery disease or myocardial infarction.   REVIEW OF SYSTEMS:  CONSTITUTIONAL: No fever or chills.  EYES: No blurry vision.  EARS: No hearing loss.  RESPIRATORY: No shortness of breath.  CARDIOVASCULAR: The patient denies chest pain.   GASTROINTESTINAL: No nausea, vomiting, or diarrhea.  GENITOURINARY: No dysuria or hematuria.  ENDOCRINE: No polyuria or polydipsia.  MUSCULOSKELETAL: The patient has a right hip fracture.  NEUROLOGIC: No focal muscle weakness or numbness.  PSYCHOLOGICAL: No depression or anxiety.   PHYSICAL EXAMINATION: VITAL SIGNS: Blood pressure 155/88, pulse 112, respirations 18, temperature 98.4,  pulse ox  94%.  HEENT: Pupils equal and reactive to light and accommodation.  NECK: Supple, without thyromegaly.  LUNGS: Clear.  HEART: Normal JVP. Normal PMI. Irregularly irregular rhythm. Normal S1, S2. No appreciable gallop, murmur, or rub.  ABDOMEN: Soft and nontender.  EXTREMITIES: No cyanosis, clubbing, or edema. Pulses were intact.  MUSCULOSKELETAL: The patient has a right hip fracture.  NEUROLOGIC: The patient is alert. Somewhat confused. Motor and sensory were both grossly intact.   IMPRESSION: A 81 year old female with a right hip fracture awaiting surgery. The patient is in atrial fibrillation with a minimally-elevated heart rate of 100 to 110 beats per minute. The patient denies chest pain. She has no prior history of myocardial infarction, congestive heart failure, cerebrovascular accident, diabetes or chronic kidney disease. The patient is at reasonable risk for right hip surgery.   RECOMMENDATIONS: 1.  Continue present medications.  2.  Up-titrate metoprolol succinate as needed.  3.  No further cardiac diagnostics at this time.  4.  Proceed with right hip surgery as planned.  5.  Continue metoprolol, pre-, peri-, and postoperatively.    ____________________________ Marcina MillardAlexander Mika Griffitts, MD ap:dm D: 03/15/2013 10:35:38 ET T: 03/15/2013 15:30:32 ET JOB#: [id number]  cc: Marcina MillardAlexander Tristian Sickinger, MD, <Dictator> Marcina MillardALEXANDER Briley Bumgarner MD ELECTRONICALLY SIGNED 03/26/2013 7:59

## 2014-11-05 NOTE — Discharge Summary (Signed)
PATIENT NAME:  Tina Maddox, Tina Maddox MR#:  [id number] DATE OF BIRTH:  22-Oct-1915  DATE OF ADMISSION:  03/15/2013 DATE OF DISCHARGE:    ADDENDUM Discharge summary done by orthopedic PA, I agree with the summary and the medications will be the same except the metoprolol. Increase the metoprolol; Lopressor is increased from 50 mg to 100 mg because of tachycardia. So, metoprolol will be 100 mg q.12 h. instead of  50 mg  q.12 and the rest of the medications and summary will be the same. [facility]    CODE STATUS: DO NOT RESUSCITATE   ____________________________ Katha HammingSnehalatha Lamoine Fredricksen, MD sk:sg D: 03/19/2013 12:37:00 ET T: 03/19/2013 12:59:00 ET JOB#: [id number]  cc: Katha HammingSnehalatha Anis Degidio, MD, <Dictator> Katha HammingSNEHALATHA Onis Markoff MD ELECTRONICALLY SIGNED 03/27/2013 22:31

## 2014-11-16 IMAGING — CR RIGHT HIP - COMPLETE 2+ VIEW
1 series · 2 of 2 positions shown · non-contrast
Comparison: none

REASON FOR EXAM: fall
COMMENTS:

[Series 1: ap · 0.17mm/px · 2 of 2 slices shown]
[im 1/2]
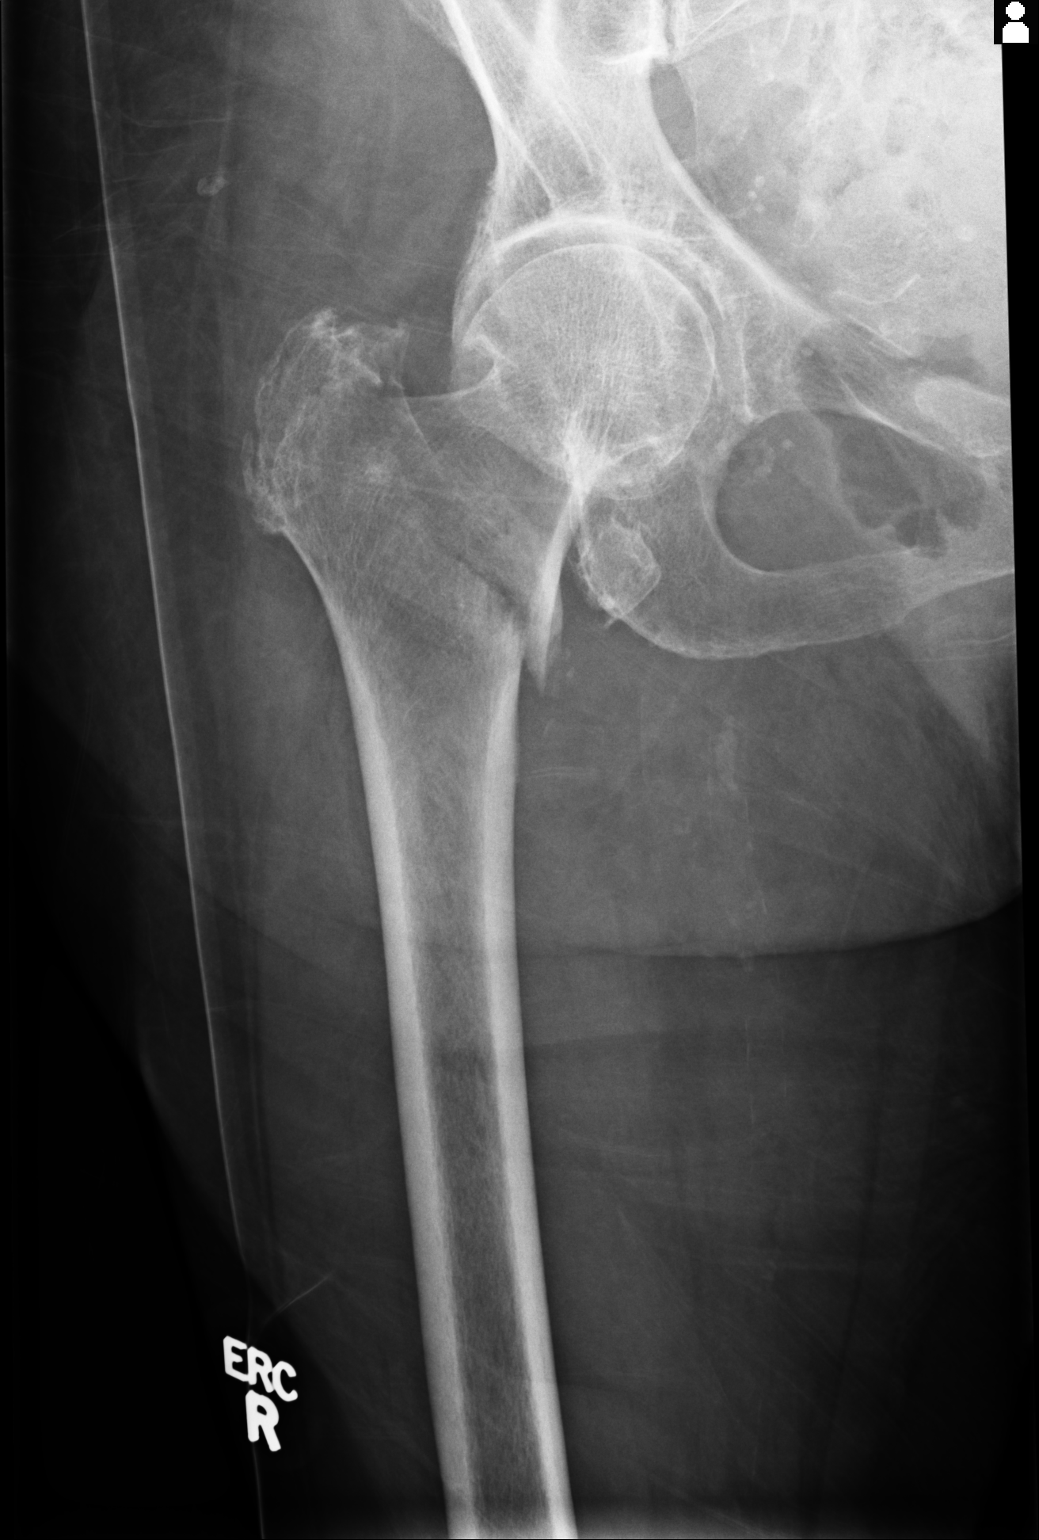
[im 2/2]
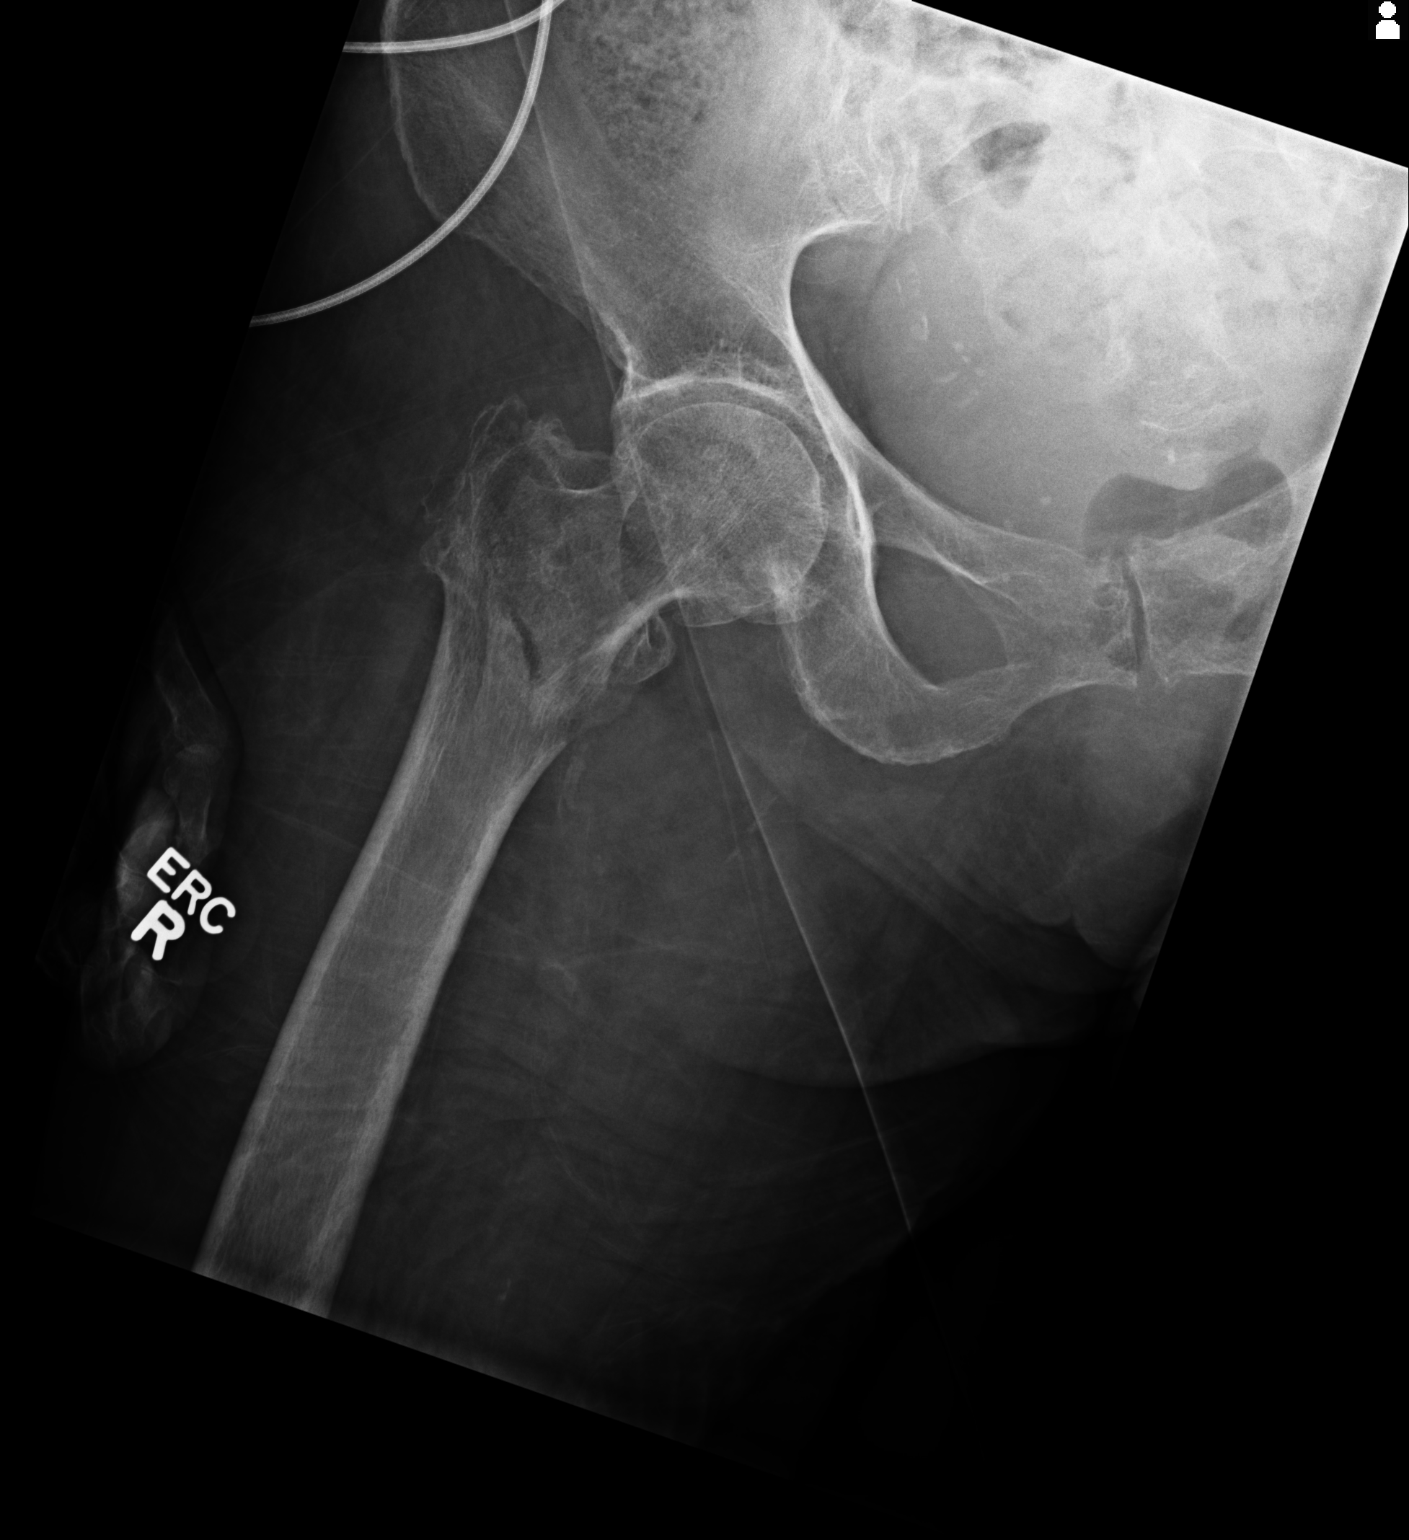

[2 of 2 positions shown; findings below may reference images not displayed]

PROCEDURE:     DXR - DXR HIP RIGHT COMPLETE  - March 15, 2013  [DATE]

RESULT:     The patient has sustained an acute intertrochanteric fracture of
the right hip. The femoral head remains normally positioned within the
acetabulum. There is a avulsion of the lesser trochanter. There are mild
degenerative changes of the hip joint. The observed portions of the right
hemipelvis exhibit no acute abnormality. The bones are diffusely osteopenic.
IMPRESSION: The patient has sustained an acute intertrochanteric
fracture of the right hip.

[REDACTED]

## 2014-11-17 IMAGING — CR DG C-ARM 1-60 MIN
3 series · 3 of 3 positions shown · non-contrast
Comparison: none

REASON FOR EXAM: Fracture reduction/fixation
COMMENTS:

PROCEDURE:     DXR - DXR C-ARM WITH 2 VIEWS RT HIP  - March 16, 2013  [DATE]
RESULT:     Images from C-arm procedure placement of an intramedullary rod
and nail combination. No C-arm evidence of intraoperative complication is
demonstrated.

[[id] (1 of 3)]
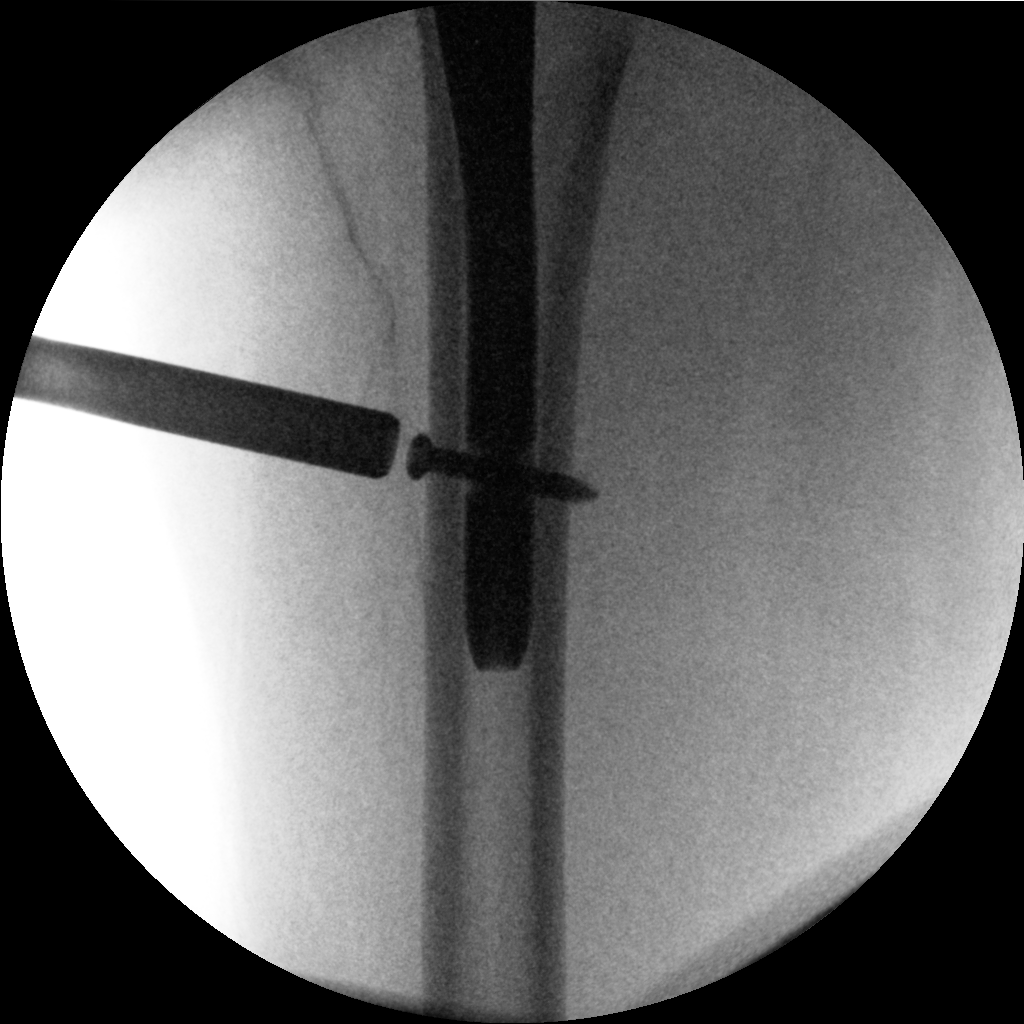

[[id] (2 of 3)]
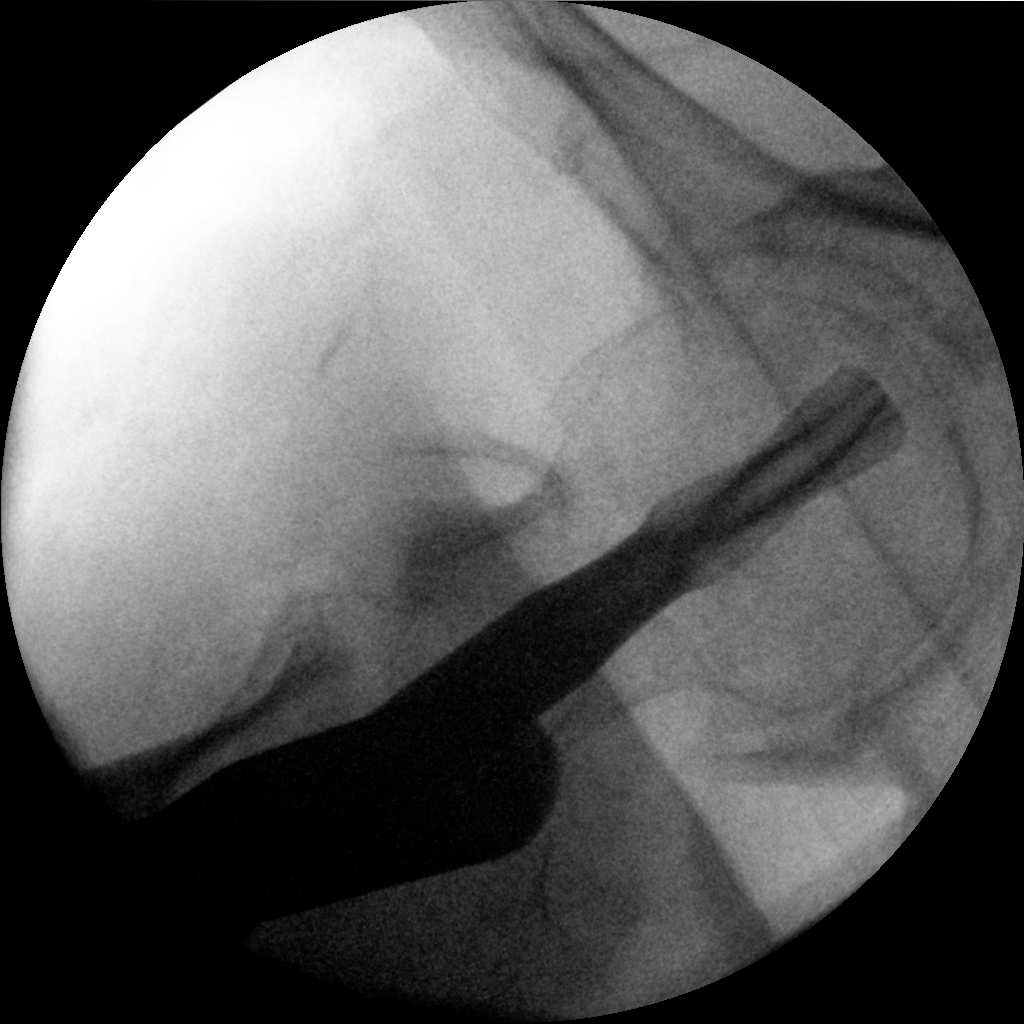

[[id] (3 of 3)]
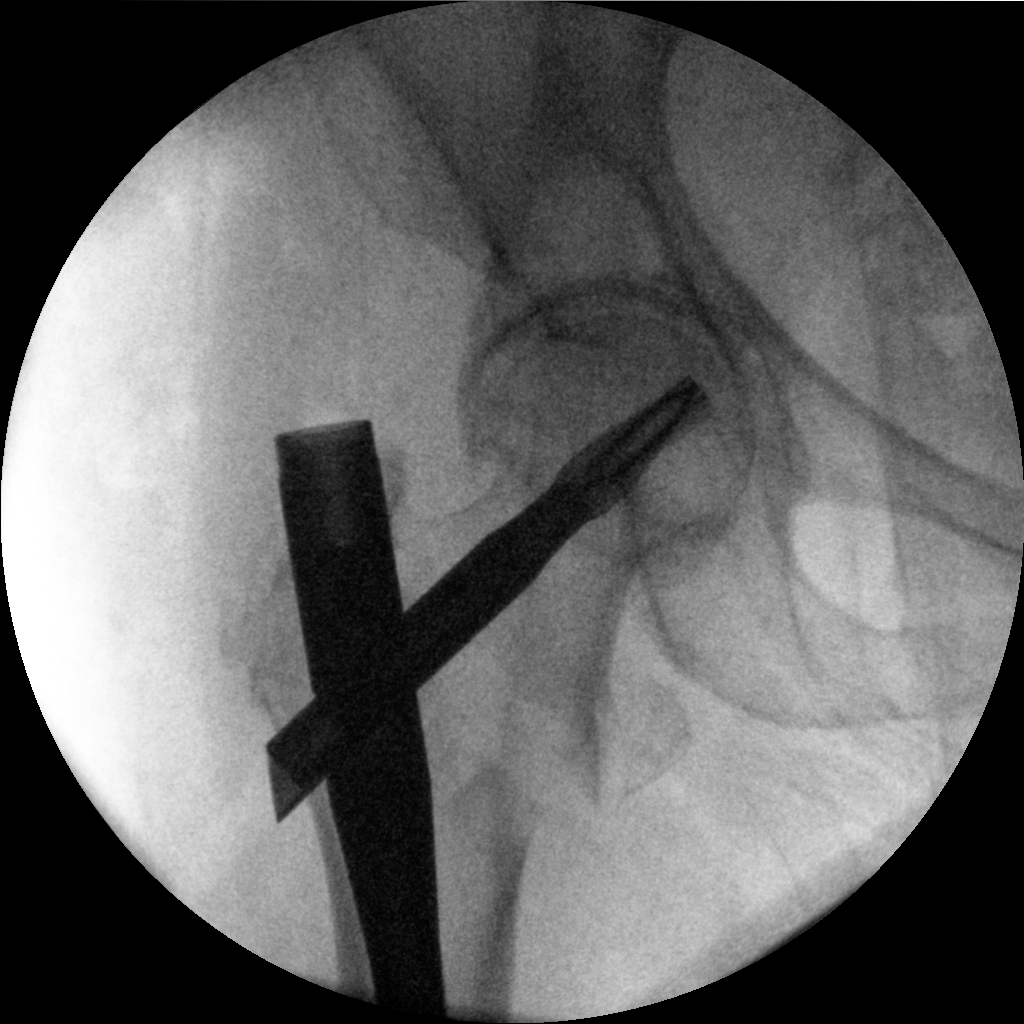

[3 of 3 positions shown; findings below may reference images not displayed]

IMPRESSION: Please see above.

[REDACTED]
